# Patient Record
Sex: Male | Born: 1965 | Race: White | Hispanic: No | Marital: Single | Smoking: Never smoker
Health system: Southern US, Community
[De-identification: ages and names within clinical notes are randomized; demographics above are authoritative.]

---

## 2004-08-06 ENCOUNTER — Emergency Department: Payer: Self-pay | Admitting: Emergency Medicine

## 2011-11-07 ENCOUNTER — Ambulatory Visit: Payer: Self-pay | Admitting: Internal Medicine

## 2011-12-21 ENCOUNTER — Encounter: Payer: Self-pay | Admitting: Orthopedic Surgery

## 2012-01-15 ENCOUNTER — Encounter: Payer: Self-pay | Admitting: Orthopedic Surgery

## 2012-02-15 ENCOUNTER — Encounter: Payer: Self-pay | Admitting: Orthopedic Surgery

## 2013-11-21 ENCOUNTER — Ambulatory Visit: Payer: Self-pay | Admitting: Physician Assistant

## 2014-03-25 ENCOUNTER — Ambulatory Visit: Payer: Self-pay | Admitting: Internal Medicine

## 2014-09-15 IMAGING — CR LEFT INDEX FINGER 2+V
1 series · 3 of 3 positions shown · non-contrast
Comparison: 11/07/2011

CLINICAL DATA: Crush injury at DIP joint.  Persistent pain.

EXAM:
LEFT INDEX FINGER 2+V

[Series 1: pa · 0.17mm/px · 3 of 3 slices shown]
[im 1/3]
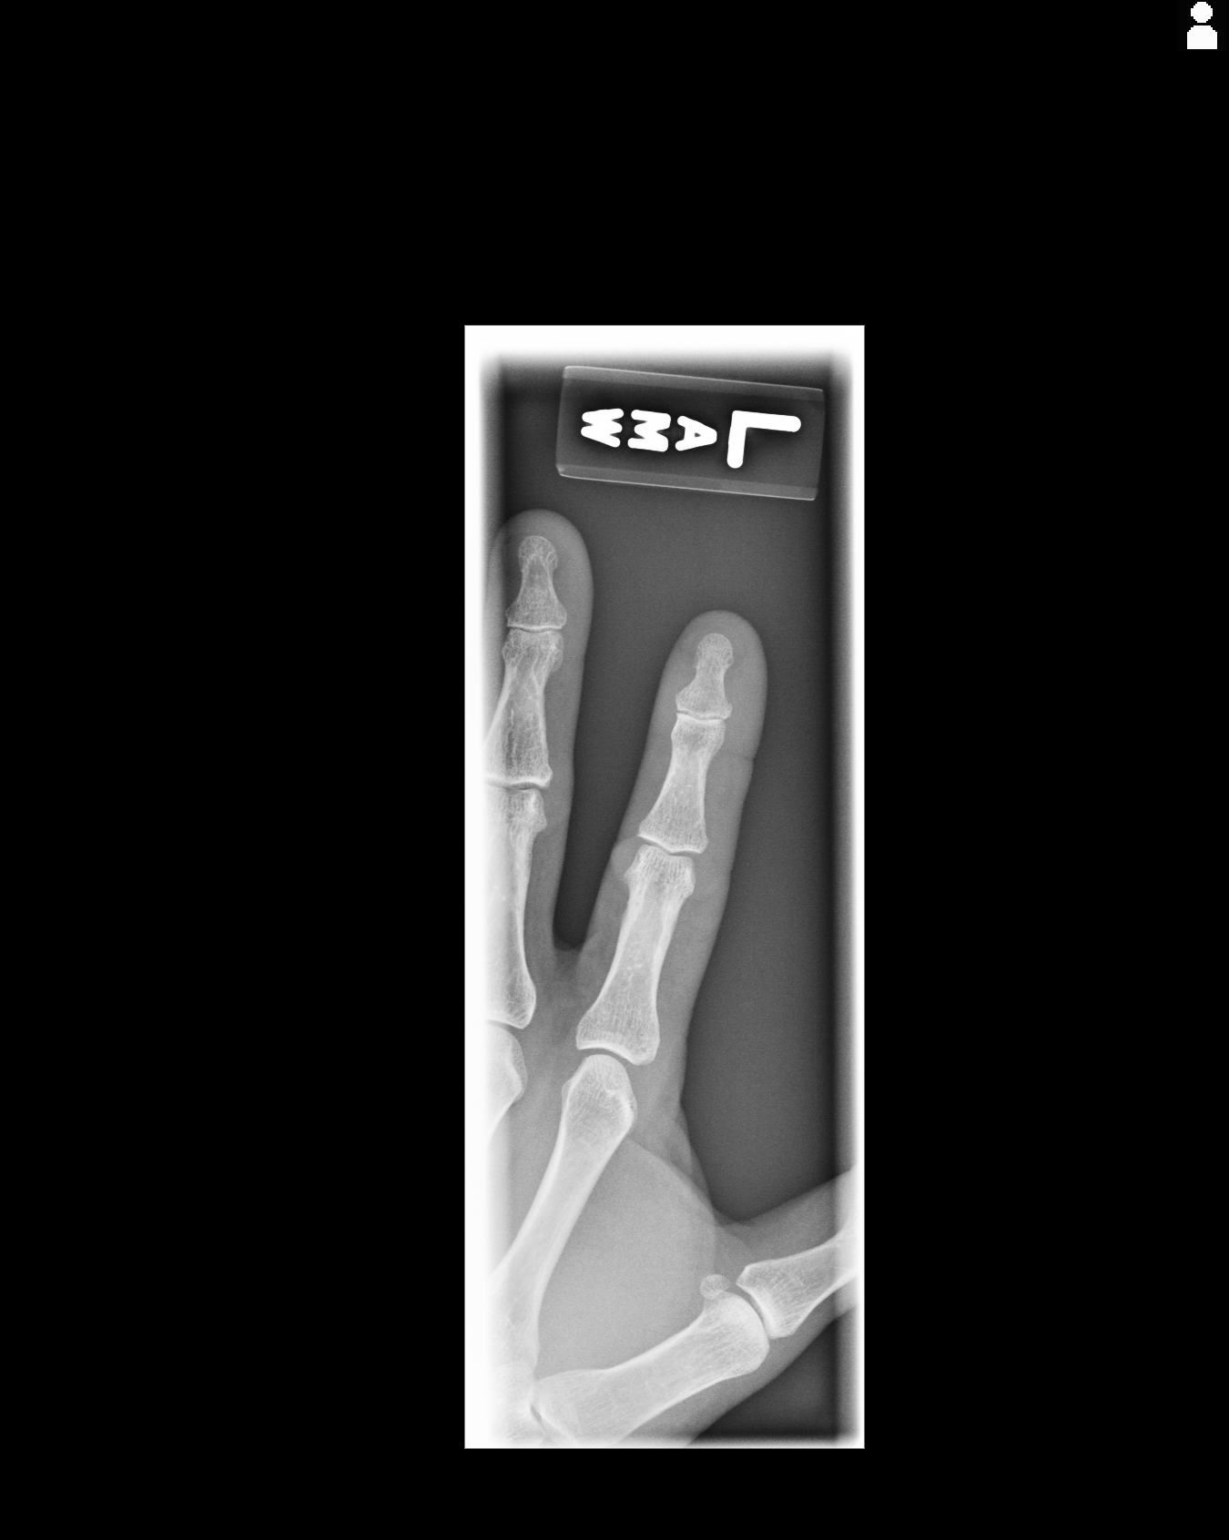
[im 2/3]
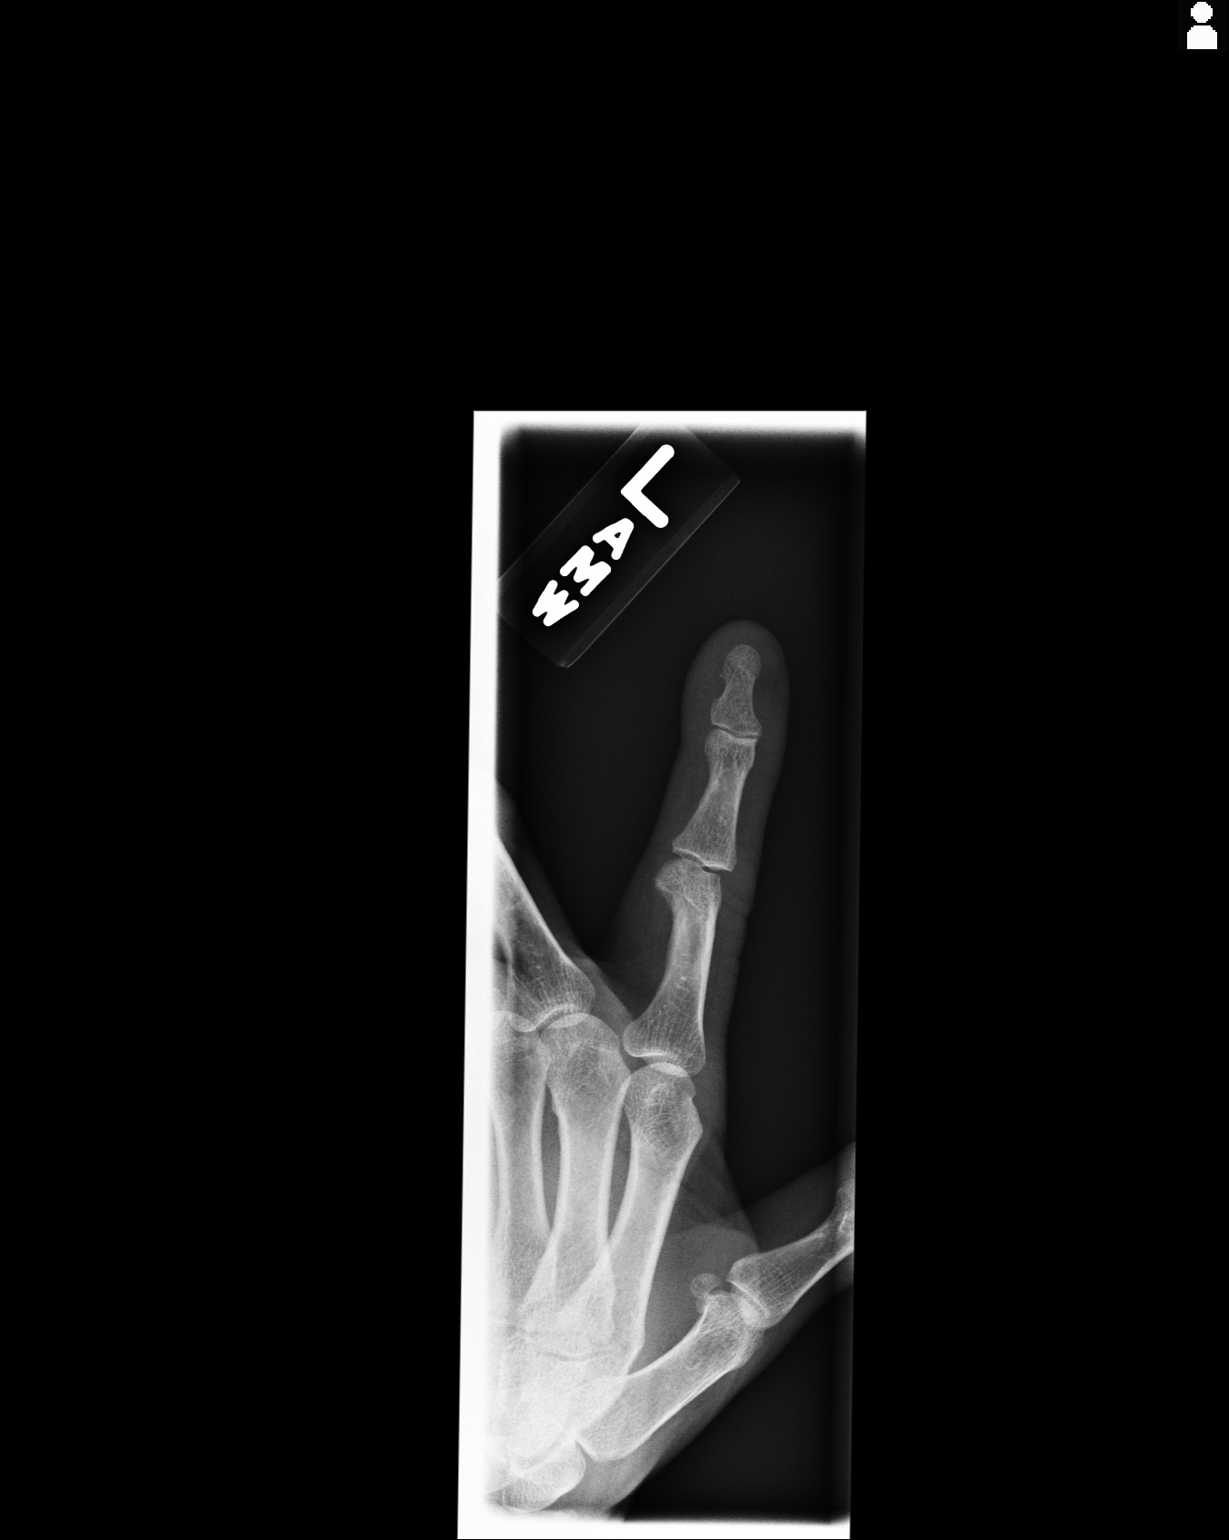
[im 3/3]
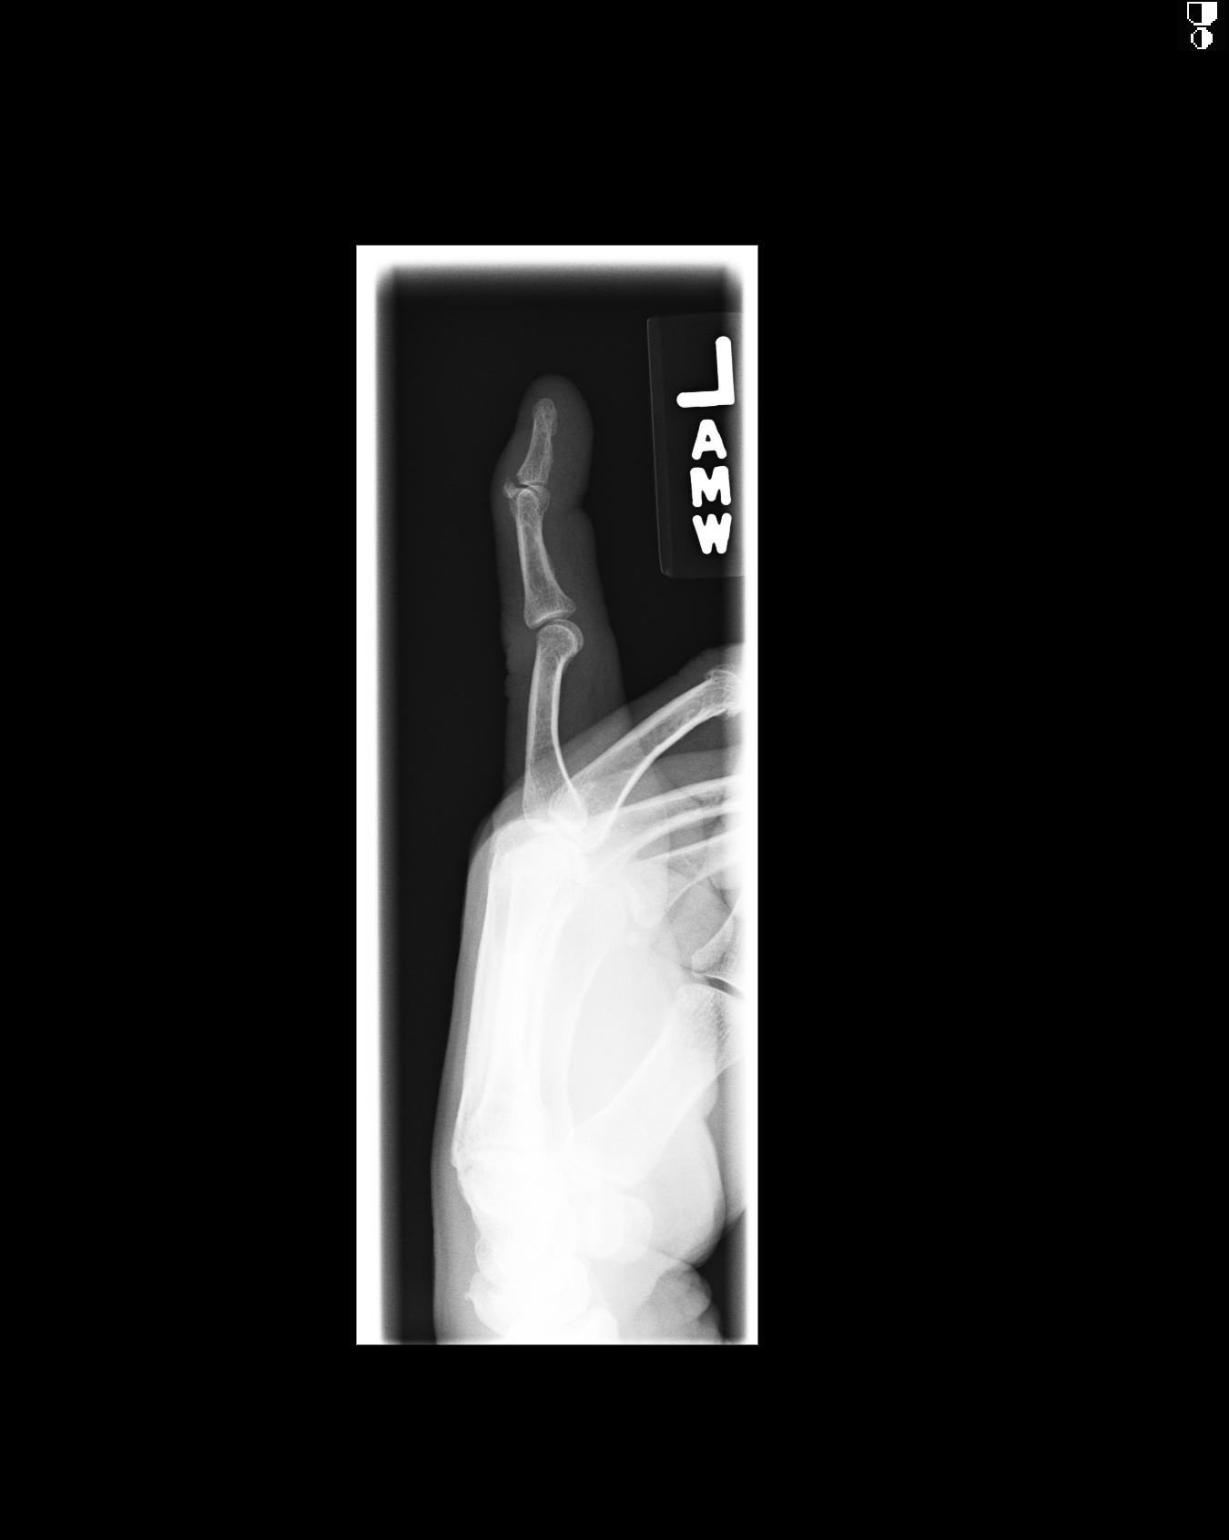

[3 of 3 positions shown; findings below may reference images not displayed]

FINDINGS: There is an avulsion fracture of the proximal dorsal corner of the
distal phalanx. This is displaced about 2 mm. No other regional
abnormality.
IMPRESSION: Avulsion fracture of the proximal dorsal corner of the distal
phalanx with retraction of about 2 mm.

## 2015-05-24 ENCOUNTER — Encounter: Payer: Self-pay | Admitting: Family Medicine

## 2015-05-24 ENCOUNTER — Ambulatory Visit (INDEPENDENT_AMBULATORY_CARE_PROVIDER_SITE_OTHER): Payer: 59 | Admitting: Family Medicine

## 2015-05-24 VITALS — BP 114/73 | HR 64 | Ht 67.5 in | Wt 149.0 lb

## 2015-05-24 DIAGNOSIS — Z0000 Encounter for general adult medical examination without abnormal findings: Secondary | ICD-10-CM

## 2015-05-24 DIAGNOSIS — M25562 Pain in left knee: Secondary | ICD-10-CM

## 2015-05-24 DIAGNOSIS — Z23 Encounter for immunization: Secondary | ICD-10-CM

## 2015-05-24 DIAGNOSIS — Z1211 Encounter for screening for malignant neoplasm of colon: Secondary | ICD-10-CM

## 2015-05-24 LAB — LIPID PANEL
CHOL/HDL RATIO: 5.1 ratio — AB (ref <=5.0)
CHOLESTEROL: 202 mg/dL — AB (ref 125–200)
HDL: 40 mg/dL (ref >=40)
LDL Cholesterol: 131 mg/dL — ABNORMAL HIGH (ref <130)
TRIGLYCERIDES: 154 mg/dL — AB (ref <150)
VLDL: 31 mg/dL — ABNORMAL HIGH (ref <30)

## 2015-05-24 LAB — CBC
HEMATOCRIT: 44.7 % (ref 39.0–52.0)
HEMOGLOBIN: 15.8 g/dL (ref 13.0–17.0)
MCH: 29.6 pg (ref 26.0–34.0)
MCHC: 35.3 g/dL (ref 30.0–36.0)
MCV: 83.9 fL (ref 78.0–100.0)
MPV: 9.8 fL (ref 8.6–12.4)
Platelets: 324 10*3/uL (ref 150–400)
RBC: 5.33 MIL/uL (ref 4.22–5.81)
RDW: 13.3 % (ref 11.5–15.5)
WBC: 7.7 10*3/uL (ref 4.0–10.5)

## 2015-05-24 LAB — COMPLETE METABOLIC PANEL WITH GFR
ALBUMIN: 4.2 g/dL (ref 3.6–5.1)
ALK PHOS: 132 U/L — AB (ref 40–115)
ALT: 28 U/L (ref 9–46)
AST: 18 U/L (ref 10–35)
BUN: 11 mg/dL (ref 7–25)
CALCIUM: 9.3 mg/dL (ref 8.6–10.3)
CO2: 24 mmol/L (ref 20–31)
Chloride: 104 mmol/L (ref 98–110)
Creat: 0.77 mg/dL (ref 0.70–1.33)
GFR, Est African American: >89 mL/min (ref >=60)
GFR, Est Non African American: >89 mL/min (ref >=60)
Glucose, Bld: 104 mg/dL — ABNORMAL HIGH (ref 65–99)
POTASSIUM: 4 mmol/L (ref 3.5–5.3)
Sodium: 141 mmol/L (ref 135–146)
Total Bilirubin: 0.5 mg/dL (ref 0.2–1.2)
Total Protein: 6.9 g/dL (ref 6.1–8.1)

## 2015-05-24 NOTE — Patient Instructions (Signed)
Dr. Preston Garabedian's General Advice Following Your Complete Physical Exam  The Benefits of Regular Exercise: Unless you suffer from an uncontrolled cardiovascular condition, studies strongly suggest that regular exercise and physical activity will add to both the quality and length of your life.  The World Health Organization recommends 150 minutes of moderate intensity aerobic activity every week.  This is best split over 3-4 days a week, and can be as simple as a brisk walk for just over 35 minutes "most days of the week".  This type of exercise has been shown to lower LDL-Cholesterol, lower average blood sugars, lower blood pressure, lower cardiovascular disease risk, improve memory, and increase one's overall sense of wellbeing.  The addition of anaerobic (or "strength training") exercises offers additional benefits including but not limited to increased metabolism, prevention of osteoporosis, and improved overall cholesterol levels.  How Can I Strive For A Low-Fat Diet?: Current guidelines recommend that 25-35 percent of your daily energy (food) intake should come from fats.  One might ask how can this be achieved without having to dissect each meal on a daily basis?  Switch to skim or 1% milk instead of whole milk.  Focus on lean meats such as ground turkey, fresh fish, baked chicken, and lean cuts of beef as your source of dietary protein.  Limit saturated fat consumption to less than 10% of your daily caloric intake.  Limit trans fatty acid consumption primarily by limiting synthetic trans fats such as partially hydrogenated oils (Ex: fried fast foods).  Substitute olive or vegetable oil for solid fats where possible.  Moderation of Salt Intake: Provided you don't carry a diagnosis of congestive heart failure nor renal failure, I recommend a daily allowance of no more than 2300 mg of salt (sodium).  Keeping under this daily goal is associated with a decreased risk of cardiovascular events, creeping  above it can lead to elevated blood pressures and increases your risk of cardiovascular events.  Milligrams (mg) of salt is listed on all nutrition labels, and your daily intake can add up faster than you think.  Most canned and frozen dinners can pack in over half your daily salt allowance in one meal.    Lifestyle Health Risks: Certain lifestyle choices carry specific health risks.  As you may already know, tobacco use has been associated with increasing one's risk of cardiovascular disease, pulmonary disease, numerous cancers, among many other issues.  What you may not know is that there are medications and nicotine replacement strategies that can more than double your chances of successfully quitting.  I would be thrilled to help manage your quitting strategy if you currently use tobacco products.  When it comes to alcohol use, I've yet to find an "ideal" daily allowance.  Provided an individual does not have a medical condition that is exacerbated by alcohol consumption, general guidelines determine "safe drinking" as no more than two standard drinks for a man or no more than one standard drink for a male per day.  However, much debate still exists on whether any amount of alcohol consumption is technically "safe".  My general advice, keep alcohol consumption to a minimum for general health promotion.  If you or others believe that alcohol, tobacco, or recreational drug use is interfering with your life, I would be happy to provide confidential counseling regarding treatment options.  General "Over The Counter" Nutrition Advice: Postmenopausal women should aim for a daily calcium intake of 1200 mg, however a significant portion of this might already be   provided by diets including milk, yogurt, cheese, and other dairy products.  Vitamin D has been shown to help preserve bone density, prevent fatigue, and has even been shown to help reduce falls in the elderly.  Ensuring a daily intake of 800 Units of  Vitamin D is a good place to start to enjoy the above benefits, we can easily check your Vitamin D level to see if you'd potentially benefit from supplementation beyond 800 Units a day.  Folic Acid intake should be of particular concern to women of childbearing age.  Daily consumption of 400-800 mcg of Folic Acid is recommended to minimize the chance of spinal cord defects in a fetus should pregnancy occur.    For many adults, accidents still remain one of the most common culprits when it comes to cause of death.  Some of the simplest but most effective preventitive habits you can adopt include regular seatbelt use, proper helmet use, securing firearms, and regularly testing your smoke and carbon monoxide detectors.  Willian Donson B. Singleton Hickox DO [facility] [address], [address] [place], [place] [address] Phone: [phone]  

## 2015-05-24 NOTE — Progress Notes (Signed)
CC: Cody Woods. is a 49 y.o. male is here for Establish Care   Subjective: HPI:  "Bed-E"  Colonoscopy:due for routine screening, referral has been placed Prostate: Discussed screening risks/beneifts with patient during today's visit, he is open to get a vitamin a PSA   Influenza Vaccine: patient declines Pneumovax: no current indication Td/Tdap: overdue for booster will receive today Zoster: (Start 50 yo)   Presented for complete physical exam with no acute complaints other than left knee pain has been present for one month, mild in severity. Worse going up and down stairs. No swelling locking catching or giving way  Review of Systems - General ROS: negative for - chills, fever, night sweats, weight gain or weight loss Ophthalmic ROS: negative for - decreased vision Psychological ROS: negative for - anxiety or depression ENT ROS: negative for - hearing change, nasal congestion, tinnitus or allergies Hematological and Lymphatic ROS: negative for - bleeding problems, bruising or swollen lymph nodes Breast ROS: negative Respiratory ROS: no cough, shortness of breath, or wheezing Cardiovascular ROS: no chest pain or dyspnea on exertion Gastrointestinal ROS: no abdominal pain, change in bowel habits, or black or bloody stools Genito-Urinary ROS: negative for - genital discharge, genital ulcers, incontinence or abnormal bleeding from genitals Musculoskeletal ROS: negative for - joint pain or muscle pain other than thatdescribed above Neurological ROS: negative for - headaches or memory loss Dermatological ROS: negative for lumps, mole changes, rash and skin lesion changes  No past medical history on file.  No past surgical history on file. No family history on file.  Social History   Social History  . Marital Status: Single    Spouse Name: N/A  . Number of Children: N/A  . Years of Education: N/A   Occupational History  . Not on file.   Social History Main  Topics  . Smoking status: Not on file  . Smokeless tobacco: Not on file  . Alcohol Use: Not on file  . Drug Use: Not on file  . Sexual Activity: Not on file   Other Topics Concern  . Not on file   Social History Narrative  . No narrative on file     Objective: BP 114/73 mmHg  Pulse 64  Ht 5' 7.5" (1.715 m)  Wt 149 lb (67.586 kg)  BMI 22.98 kg/m2  General: No Acute Distress HEENT: Atraumatic, normocephalic, conjunctivae normal without scleral icterus.  No nasal discharge, hearing grossly intact, TMs with good landmarks bilaterally with no middle ear abnormalities, posterior pharynx clear without oral lesions. Neck: Supple, trachea midline, no cervical nor supraclavicular adenopathy. Pulmonary: Clear to auscultation bilaterally without wheezing, rhonchi, nor rales. Cardiac: Regular rate and rhythm.  No murmurs, rubs, nor gallops. No peripheral edema.  2+ peripheral pulses bilaterally. Abdomen: Bowel sounds normal.  No masses.  Non-tender without rebound.  Negative Murphy's sign. MSK: Grossly intact, no signs of weakness.  Full strength throughout upper and lower extremities.  Full ROM in upper and lower extremities.  No midline spinal tenderness.  Neuro: Gait unremarkable, CN II-XII grossly intact.  C5-C6 Reflex 2/4 Bilaterally, L4 Reflex 2/4 Bilaterally.  Cerebellar function intact. Skin: No rashes. Psych: Alert and oriented to person/place/time.  Thought process normal. No anxiety/depression.  Assessment & Plan: Theophil was seen today for establish care.  Diagnoses and all orders for this visit:  Annual physical exam -     Lipid panel -     PSA -     CBC -  COMPLETE METABOLIC PANEL WITH GFR -     Ambulatory referral to Gastroenterology  Special screening for malignant neoplasms, colon -     Ambulatory referral to Gastroenterology  Left knee pain   Healthy lifestyle interventions including but not limited to regular exercise, a healthy low fat diet, moderation of  salt intake, the dangers of tobacco/alcohol/recreational drug use, nutrition supplementation, and accident avoidance were discussed with the patient and a handout was provided for future reference. He was given a home rehabilitation plan for his knee pain that is suspected to be due to patellofemoral syndrome.  Return in about 1 year (around 05/23/2016) for annual physical.

## 2015-05-25 ENCOUNTER — Encounter: Payer: Self-pay | Admitting: Family Medicine

## 2015-05-25 DIAGNOSIS — E785 Hyperlipidemia, unspecified: Secondary | ICD-10-CM | POA: Insufficient documentation

## 2015-05-25 LAB — PSA: PSA: 1.15 ng/mL (ref <=4.00)

## 2016-07-12 ENCOUNTER — Ambulatory Visit (INDEPENDENT_AMBULATORY_CARE_PROVIDER_SITE_OTHER): Payer: Managed Care, Other (non HMO)

## 2016-07-12 ENCOUNTER — Encounter: Payer: Self-pay | Admitting: Physician Assistant

## 2016-07-12 ENCOUNTER — Ambulatory Visit (INDEPENDENT_AMBULATORY_CARE_PROVIDER_SITE_OTHER): Payer: Managed Care, Other (non HMO) | Admitting: Physician Assistant

## 2016-07-12 ENCOUNTER — Other Ambulatory Visit: Payer: Self-pay | Admitting: Physician Assistant

## 2016-07-12 VITALS — BP 128/85 | HR 80 | Wt 161.0 lb

## 2016-07-12 DIAGNOSIS — E559 Vitamin D deficiency, unspecified: Secondary | ICD-10-CM

## 2016-07-12 DIAGNOSIS — E782 Mixed hyperlipidemia: Secondary | ICD-10-CM

## 2016-07-12 DIAGNOSIS — R7401 Elevation of levels of liver transaminase levels: Secondary | ICD-10-CM

## 2016-07-12 DIAGNOSIS — M5412 Radiculopathy, cervical region: Secondary | ICD-10-CM

## 2016-07-12 DIAGNOSIS — I951 Orthostatic hypotension: Secondary | ICD-10-CM | POA: Diagnosis not present

## 2016-07-12 DIAGNOSIS — Z0000 Encounter for general adult medical examination without abnormal findings: Secondary | ICD-10-CM | POA: Diagnosis not present

## 2016-07-12 DIAGNOSIS — R740 Nonspecific elevation of levels of transaminase and lactic acid dehydrogenase [LDH]: Secondary | ICD-10-CM

## 2016-07-12 LAB — LIPID PANEL
CHOL/HDL RATIO: 5.2 ratio — AB (ref <5.0)
CHOLESTEROL: 263 mg/dL — AB (ref <200)
HDL: 51 mg/dL (ref >40)
LDL Cholesterol: 169 mg/dL — ABNORMAL HIGH (ref <100)
TRIGLYCERIDES: 216 mg/dL — AB (ref <150)
VLDL: 43 mg/dL — AB (ref <30)

## 2016-07-12 LAB — CBC
HEMATOCRIT: 47.1 % (ref 38.5–50.0)
Hemoglobin: 15.7 g/dL (ref 13.2–17.1)
MCH: 28.8 pg (ref 27.0–33.0)
MCHC: 33.3 g/dL (ref 32.0–36.0)
MCV: 86.4 fL (ref 80.0–100.0)
MPV: 9.7 fL (ref 7.5–12.5)
PLATELETS: 340 10*3/uL (ref 140–400)
RBC: 5.45 MIL/uL (ref 4.20–5.80)
RDW: 14.1 % (ref 11.0–15.0)
WBC: 9.5 10*3/uL (ref 3.8–10.8)

## 2016-07-12 LAB — COMPREHENSIVE METABOLIC PANEL
ALT: 110 U/L — ABNORMAL HIGH (ref 9–46)
AST: 46 U/L — AB (ref 10–35)
Albumin: 4.3 g/dL (ref 3.6–5.1)
Alkaline Phosphatase: 104 U/L (ref 40–115)
BILIRUBIN TOTAL: 0.3 mg/dL (ref 0.2–1.2)
BUN: 13 mg/dL (ref 7–25)
CALCIUM: 9.4 mg/dL (ref 8.6–10.3)
CO2: 22 mmol/L (ref 20–31)
Chloride: 104 mmol/L (ref 98–110)
Creat: 1.52 mg/dL — ABNORMAL HIGH (ref 0.70–1.33)
GLUCOSE: 93 mg/dL (ref 65–99)
POTASSIUM: 4.8 mmol/L (ref 3.5–5.3)
Sodium: 142 mmol/L (ref 135–146)
Total Protein: 7 g/dL (ref 6.1–8.1)

## 2016-07-12 LAB — TSH: TSH: 0.92 mIU/L (ref 0.40–4.50)

## 2016-07-12 LAB — HEMOGLOBIN A1C
Hgb A1c MFr Bld: 5.2 % (ref <5.7)
Mean Plasma Glucose: 103 mg/dL

## 2016-07-12 MED ORDER — PREDNISONE 50 MG PO TABS: ORAL_TABLET | ORAL | 0 refills | Status: AC

## 2016-07-12 MED ORDER — MELOXICAM 15 MG PO TABS: 15.0000 mg | ORAL_TABLET | Freq: Every day | ORAL | 0 refills | Status: AC

## 2016-07-12 NOTE — Progress Notes (Signed)
HPI:                                                                Cody PeltonBedie Frankie Sagen Jr. is a 50 y.o. male [facility] today to establish care   Cervical Radiculopathy: [facility] 07[facility]/16 for right arm pain and right thumb paresthesias. No imaging was performed, but he was diagnosed with C5-C6 radiculopathy. He states that approx. 1 month ago he developed sudden onset right arm pain that felt like someone "hit him with a baseball bat." He was not doing anything at the time and denies any trauma/injury. He continues to have paresthesias throughout his right arm and thumb that he describes as "things crawling on him." He states that he works with his hands as a Chartered certified accountantmachinist and is having difficulty with tasks involving his thumb, such as screwing a nut on a bolt. Denies neck pain. He is currently taking Voltaren BID.  Hyperlipidemia: CHD risk 4.9%. Not currently on a statin. Lab Results  Component Value Date   CHOL 202 (H) 05/24/2015   HDL 40 05/24/2015   LDLCALC 131 (H) 05/24/2015   TRIG 154 (H) 05/24/2015   CHOLHDL 5.1 (H) 05/24/2015   Additionally, patient states that he occasionally becomes lightheaded and has associated vision change like "seeing stars." He denies syncope or vertigo. He recovers within seconds and is able to resume normal activity. He drinks soft drinks and coffee, but admits that he does not drink enough water.  He is due for a colonoscopy and interested in Cologuard. He declines influenza vaccine   Health Maintenance Health Maintenance  Topic Date Due  . HIV Screening  01/18/1980  . COLONOSCOPY  01/18/2015  . INFLUENZA VACCINE  07/12/2017 (Originally 02/15/2016)  . TETANUS/TDAP  05/23/2025    No past medical history on file. No past surgical history on file. Social History  Substance Use Topics  . Smoking status: Never Smoker  . Smokeless tobacco: Not on file  . Alcohol use  Not on file   family history is not on file.  Review of Systems  Constitutional: Negative.   HENT: Negative.   Eyes: Positive for blurred vision.  Respiratory: Negative for shortness of breath.   Cardiovascular: Negative for chest pain and palpitations.  Gastrointestinal: Negative.   Genitourinary: Negative.   Musculoskeletal: Positive for myalgias (right arm).  Skin: Negative.   Neurological: Positive for tingling and sensory change (numbness R thumb). Negative for dizziness, tremors, focal weakness, loss of consciousness and headaches.       Lightheadedness  Psychiatric/Behavioral: Negative.     Medications: Current Outpatient Prescriptions  Medication Sig Dispense Refill  . diclofenac (VOLTAREN) 75 MG EC tablet     . meloxicam (MOBIC) 15 MG tablet Take 1 tablet (15 mg total) by mouth daily. 30 tablet 0  . predniSONE (DELTASONE) 50 MG tablet One tab PO daily for 5 days. 5 tablet 0   No current facility-administered medications for this visit.    Not on File     Objective:  BP 128/85   Pulse 80   Wt 161 lb (73 kg)   BMI 24.84 kg/m  Gen: Alert, not ill-appearing, no distress HEENT: Extraocular movements intact, normal conjunctiva,  TM's clear, oropharynx clear, moist mucus membranes, no thyromegaly or tenderness Lungs: Normal work of breathing, clear to auscultation bilaterally Heart: Normal rate, regular rhythm, s1 and s2 distinct, no murmurs, clicks or rubs appreciated on this exam Abd: Soft. Nondistended, Nontender Extremities: distal pulses intact, no peripheral edema Neuro: sensation grossly intact, normal sharp-dull test Hands/Arms: no deformity, grip strength 5/5 and symmetric, negative froment's sign, full active range of motion Neck: supple, full active ROM, no spinous process tenderness Skin: warm and dry, no rashes or lesions on exposed skin Psych: normal affect, pleasant mood, normal speech   No results found for this or any previous visit (from the past  72 hour(s)). Dg Cervical Spine Complete  Result Date: 07/12/2016 CLINICAL DATA:  Right neck pain with radiculopathy for 1 month. No trauma. EXAM: CERVICAL SPINE - COMPLETE 4+ VIEW COMPARISON:  None. FINDINGS: The pre odontoid space and prevertebral soft tissues are normal. C7 is partially obscured by overlying soft tissues on today's study. The cervicothoracic junction is not well identified. Within visualize limits, no malalignment is identified. Degenerative changes are seen with an anterior osteophyte at C3-4. No fractures are noted. The neural foramina are patent. The lateral masses of C1 align with C2. Limited views of the odontoid process are normal. The lung apices are unremarkable. IMPRESSION: Limited visualization of C7 and the cervicothoracic junction. No fracture traumatic malalignment. Mild degenerative changes. Electronically Signed   By: Gerome Samavid  Williams III M.D   On: 07/12/2016 10:11      Assessment and Plan: 50 y.o. male with   1. Cervical radiculopathy - no focal weakness on exam today - Cspine films consistent with degenerative changes - Ambulatory referral to Physical Therapy - meloxicam (MOBIC) 15 MG tablet; Take 1 tablet (15 mg total) by mouth daily.  Dispense: 30 tablet; Refill: 0 - Prednisone burst - 50mg  x 5 days - referred to Sports Medicine for follow-up  2. Mixed hyperlipidemia - rechecking lipid panel today - ACSVD risk <10%  3. Encounter for preventative adult health care examination - Cologuard ordered - CBC - Comprehensive metabolic panel - Hemoglobin A1c - Lipid panel - TSH - PSA, total and free - VITAMIN D 25 Hydroxy (Vit-D Deficiency, Fractures)  4. Orthostatic Hypotension - discussed that orthostasis secondary to dehydration is likely causing his intermittent lightheadedness - encouraged hydration with water and to move slowly with position changes  Patient education and anticipatory guidance given Patient agrees with treatment plan Follow-up  in 1 year or sooner as needed  Levonne Hubertharley E. Elspeth Blucher PA-C

## 2016-07-12 NOTE — Patient Instructions (Signed)
Cervical Radiculopathy Introduction Cervical radiculopathy happens when a nerve in the neck (cervical nerve) is pinched or bruised. This condition can develop because of an injury or as part of the normal aging process. Pressure on the cervical nerves can cause pain or numbness that runs from the neck all the way down into the arm and fingers. Usually, this condition gets better with rest. Treatment may be needed if the condition does not improve. What are the causes? This condition may be caused by:  Injury.  Slipped (herniated) disk.  Muscle tightness in the neck because of overuse.  Arthritis.  Breakdown or degeneration in the bones and joints of the spine (spondylosis) due to aging.  Bone spurs that may develop near the cervical nerves. What are the signs or symptoms? Symptoms of this condition include:  Pain that runs from the neck to the arm and hand. The pain can be severe or irritating. It may be worse when the neck is moved.  Numbness or weakness in the affected arm and hand. How is this diagnosed? This condition may be diagnosed based on symptoms, medical history, and a physical exam. You may also have tests, including:  X-rays.  CT scan.  MRI.  Electromyogram (EMG).  Nerve conduction tests. How is this treated? In many cases, treatment is not needed for this condition. With rest, the condition usually gets better over time. If treatment is needed, options may include:  Wearing a soft neck collar for short periods of time.  Physical therapy to strengthen your neck muscles.  Medicines, such as NSAIDs, oral corticosteroids, or spinal injections.  Surgery. This may be needed if other treatments do not help. Various types of surgery may be done depending on the cause of your problems. Follow these instructions at home: Managing pain  Take over-the-counter and prescription medicines only as told by your health care provider.  If directed, apply ice to the  affected area.  Put ice in a plastic bag.  Place a towel between your skin and the bag.  Leave the ice on for 20 minutes, 2-3 times per day.  If ice does not help, you can try using heat. Take a warm shower or warm bath, or use a heat pack as told by your health care provider.  Try a gentle neck and shoulder massage to help relieve symptoms. Activity  Rest as needed. Follow instructions from your health care provider about any restrictions on activities.  Do stretching and strengthening exercises as told by your health care provider or physical therapist. General instructions  If you were given a soft collar, wear it as told by your health care provider.  Use a flat pillow when you sleep.  Keep all follow-up visits as told by your health care provider. This is important. Contact a health care provider if:  Your condition does not improve with treatment. Get help right away if:  Your pain gets much worse and cannot be controlled with medicines.  You have weakness or numbness in your hand, arm, face, or leg.  You have a high fever.  You have a stiff, rigid neck.  You lose control of your bowels or your bladder (have incontinence).  You have trouble with walking, balance, or speaking. This information is not intended to replace advice given to you by your health care provider. Make sure you discuss any questions you have with your health care provider. Document Released: 03/28/2001 Document Revised: 12/09/2015 Document Reviewed: 08/27/2014  2017 [employer]vier  

## 2016-07-13 DIAGNOSIS — R740 Nonspecific elevation of levels of transaminase and lactic acid dehydrogenase [LDH]: Secondary | ICD-10-CM

## 2016-07-13 DIAGNOSIS — R7401 Elevation of levels of liver transaminase levels: Secondary | ICD-10-CM | POA: Insufficient documentation

## 2016-07-13 DIAGNOSIS — E559 Vitamin D deficiency, unspecified: Secondary | ICD-10-CM | POA: Insufficient documentation

## 2016-07-13 LAB — PSA, TOTAL AND FREE
PSA, % Free: 38 % (ref >25)
PSA, Free: 0.5 ng/mL
PSA, Total: 1.3 ng/mL (ref <=4.0)

## 2016-07-13 LAB — VITAMIN D 25 HYDROXY (VIT D DEFICIENCY, FRACTURES): Vit D, 25-Hydroxy: 17 ng/mL — ABNORMAL LOW (ref 30–100)

## 2016-07-13 MED ORDER — VITAMIN D (ERGOCALCIFEROL) 1.25 MG (50000 UNIT) PO CAPS: 50000.0000 [IU] | ORAL_CAPSULE | ORAL | 0 refills | Status: AC

## 2016-07-14 ENCOUNTER — Other Ambulatory Visit: Payer: Self-pay

## 2016-07-14 DIAGNOSIS — R740 Nonspecific elevation of levels of transaminase and lactic acid dehydrogenase [LDH]: Secondary | ICD-10-CM

## 2016-07-14 DIAGNOSIS — Z1159 Encounter for screening for other viral diseases: Secondary | ICD-10-CM

## 2016-07-14 DIAGNOSIS — R748 Abnormal levels of other serum enzymes: Secondary | ICD-10-CM

## 2016-07-14 DIAGNOSIS — R7401 Elevation of levels of liver transaminase levels: Secondary | ICD-10-CM

## 2016-07-14 NOTE — Addendum Note (Signed)
Addended by: Gena FrayUMMINGS, CHARLEY E on: 07/14/2016 03:36 PM   Modules accepted: Orders

## 2016-07-15 LAB — HEPATITIS C ANTIBODY: HCV AB: NEGATIVE

## 2016-07-18 ENCOUNTER — Other Ambulatory Visit: Payer: Self-pay

## 2016-07-18 DIAGNOSIS — R7401 Elevation of levels of liver transaminase levels: Secondary | ICD-10-CM

## 2016-07-18 DIAGNOSIS — R740 Nonspecific elevation of levels of transaminase and lactic acid dehydrogenase [LDH]: Principal | ICD-10-CM
